# Patient Record
Sex: Female | Born: 1980 | Race: Black or African American | Hispanic: No | Marital: Single | Smoking: Never smoker
Health system: Southern US, Community
[De-identification: ages and names within clinical notes are randomized; demographics above are authoritative.]

## PROBLEM LIST (undated history)

## (undated) HISTORY — PX: ABDOMINAL HYSTERECTOMY: SHX81

---

## 2012-11-14 ENCOUNTER — Encounter: Payer: Self-pay | Admitting: *Deleted

## 2012-11-14 ENCOUNTER — Ambulatory Visit: Payer: Self-pay

## 2012-11-14 DIAGNOSIS — R0602 Shortness of breath: Secondary | ICD-10-CM

## 2012-11-14 DIAGNOSIS — R079 Chest pain, unspecified: Secondary | ICD-10-CM | POA: Insufficient documentation

## 2012-11-14 LAB — COMPREHENSIVE METABOLIC PANEL
Albumin: 3.7 g/dL (ref 3.4–5.0)
Alkaline Phosphatase: 78 U/L (ref 50–136)
Anion Gap: 12 (ref 7–16)
Bilirubin,Total: 0.5 mg/dL (ref 0.2–1.0)
Calcium, Total: 9.4 mg/dL (ref 8.5–10.1)
Co2: 28 mmol/L (ref 21–32)
EGFR (African American): >60
Glucose: 96 mg/dL (ref 65–99)
Osmolality: 279 (ref 275–301)
Potassium: 3.5 mmol/L (ref 3.5–5.1)
SGOT(AST): 16 U/L (ref 15–37)
SGPT (ALT): 35 U/L (ref 12–78)
Total Protein: 8.3 g/dL — ABNORMAL HIGH (ref 6.4–8.2)

## 2012-11-14 LAB — URINALYSIS, COMPLETE
Bilirubin,UR: NEGATIVE
Blood: NEGATIVE
Glucose,UR: NEGATIVE mg/dL (ref 0–75)
Ketone: NEGATIVE
Leukocyte Esterase: NEGATIVE
Nitrite: NEGATIVE
Ph: 6 (ref 4.5–8.0)

## 2012-11-14 LAB — CBC WITH DIFFERENTIAL/PLATELET
Basophil #: 0.1 10*3/uL (ref 0.0–0.1)
Basophil %: 1 %
Eosinophil %: 0.6 %
HCT: 39.6 % (ref 35.0–47.0)
Lymphocyte #: 2.5 10*3/uL (ref 1.0–3.6)
Lymphocyte %: 23.3 %
MCH: 27.3 pg (ref 26.0–34.0)
MCHC: 32.7 g/dL (ref 32.0–36.0)
Monocyte #: 0.6 x10 3/mm (ref 0.2–0.9)
Monocyte %: 5.9 %
Neutrophil #: 7.5 10*3/uL — ABNORMAL HIGH (ref 1.4–6.5)
Neutrophil %: 69.2 %
Platelet: 290 10*3/uL (ref 150–440)
RDW: 13.3 % (ref 11.5–14.5)
WBC: 10.9 10*3/uL (ref 3.6–11.0)

## 2012-11-14 LAB — OCCULT BLOOD X 1 CARD TO LAB, STOOL: Occult Blood, Feces: NEGATIVE

## 2012-11-14 LAB — AMYLASE: Amylase: 46 U/L (ref 25–115)

## 2012-11-15 ENCOUNTER — Ambulatory Visit: Payer: PRIVATE HEALTH INSURANCE | Admitting: Cardiovascular Disease

## 2012-11-15 ENCOUNTER — Ambulatory Visit: Payer: Self-pay

## 2014-08-18 IMAGING — US ABDOMEN ULTRASOUND LIMITED
1 series · 14 of 25 positions shown · non-contrast
Comparison: none

REASON FOR EXAM: RUQ abd pain gallbladder pancreas liver
COMMENTS:

PROCEDURE:     US  - US ABDOMEN LIMITED SURVEY  - November 15, 2012  [DATE]
RESULT:     History: Pain.
Comparison Study: No prior.
Fines: Liver normal. Pancreas normal. Gallbladder normal. Gallbladder wall
thickness 2.3 mm. Common bile duct caliber 3.4 mm.

[Series 1: abdomen ultrasound limited · 0.31mm/px · 14 of 60 slices shown]
[im 1/60]
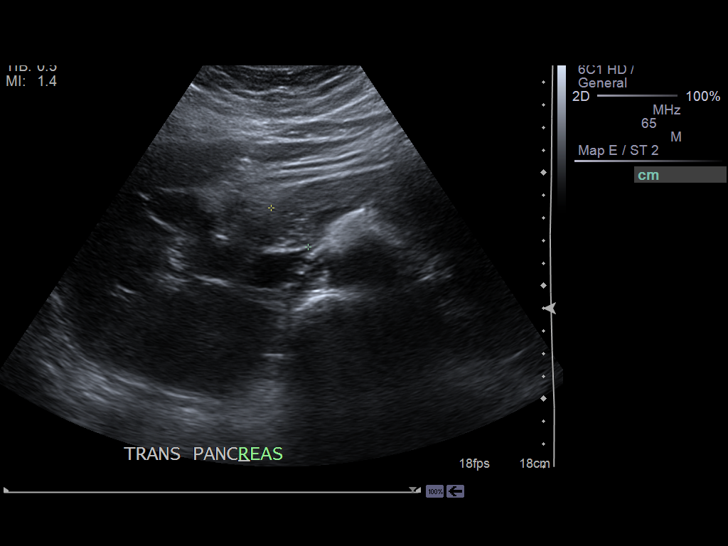
[im 5/60]
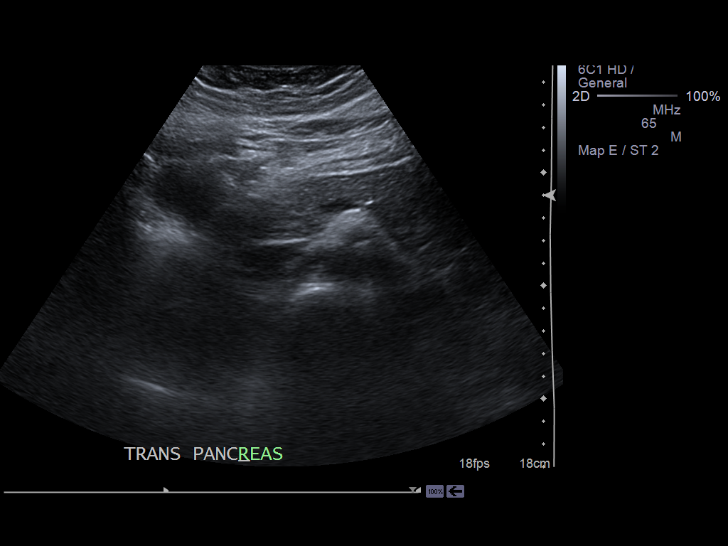
[im 10/60]
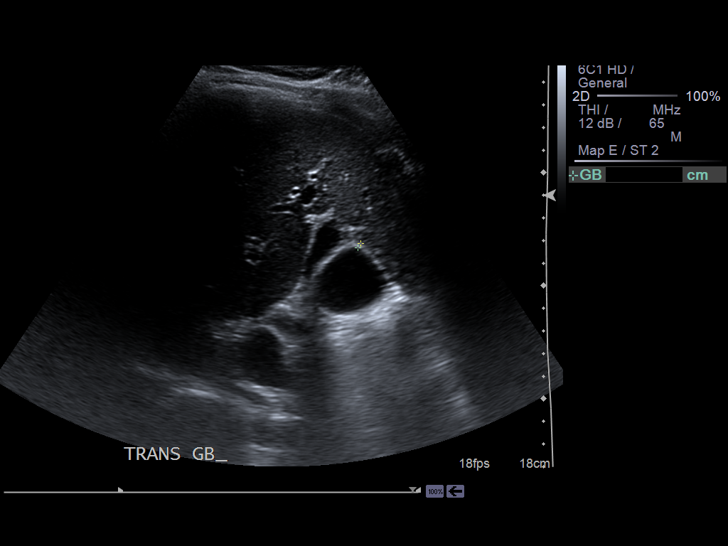
[im 15/60]
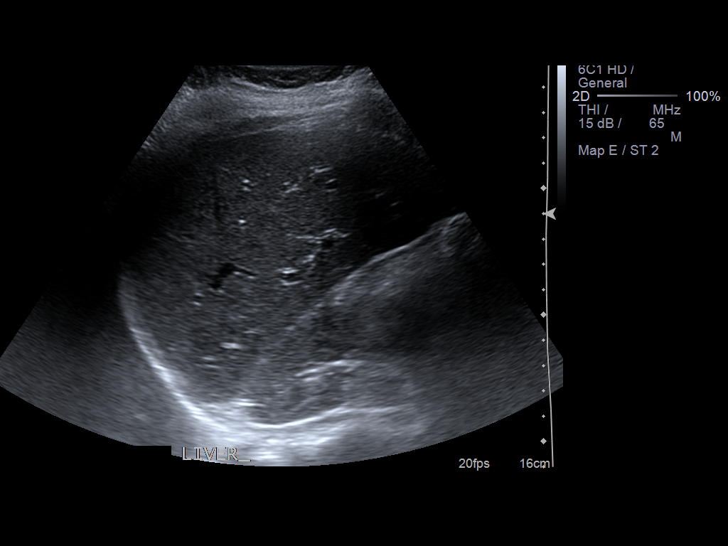
[im 20/60]
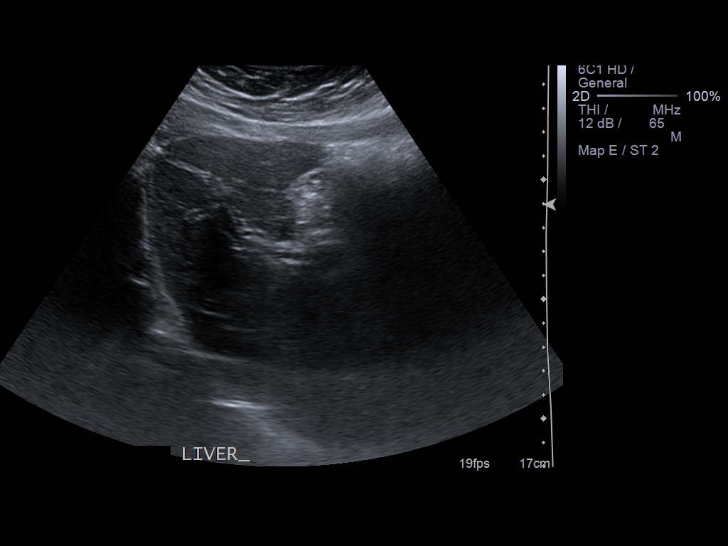
[im 23/60]
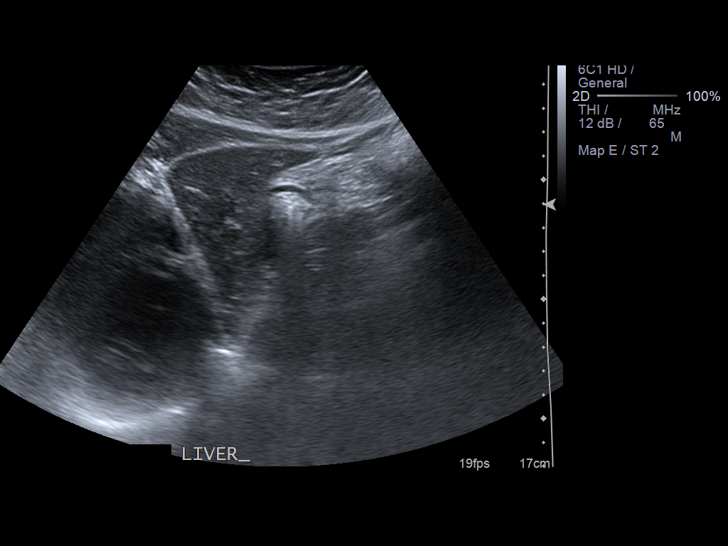
[im 28/60]
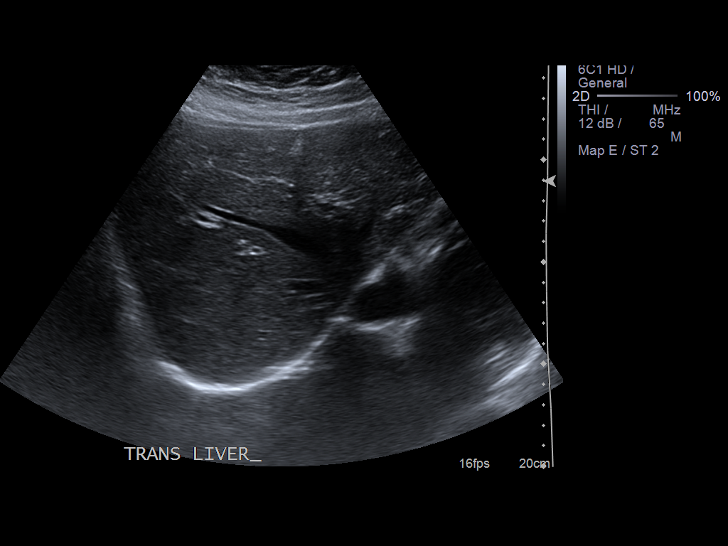
[im 32/60]
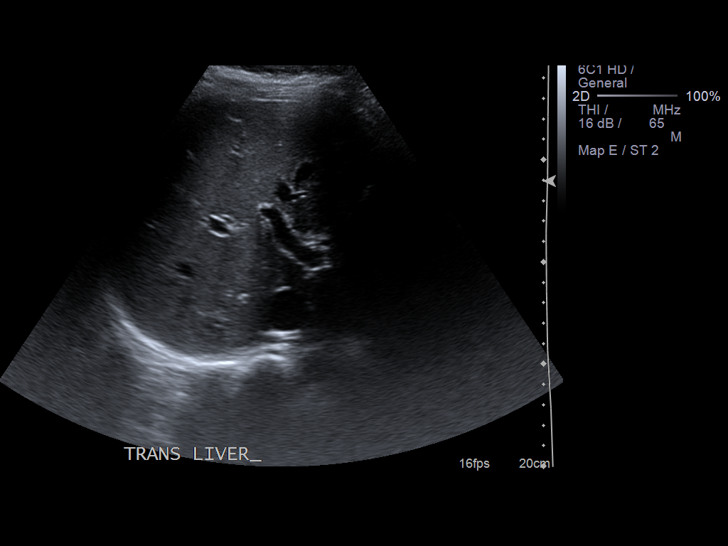
[im 37/60]
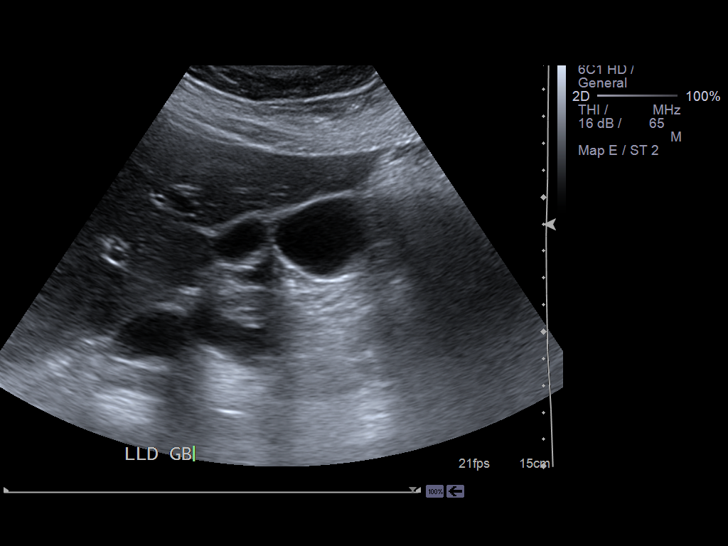
[im 40/60]
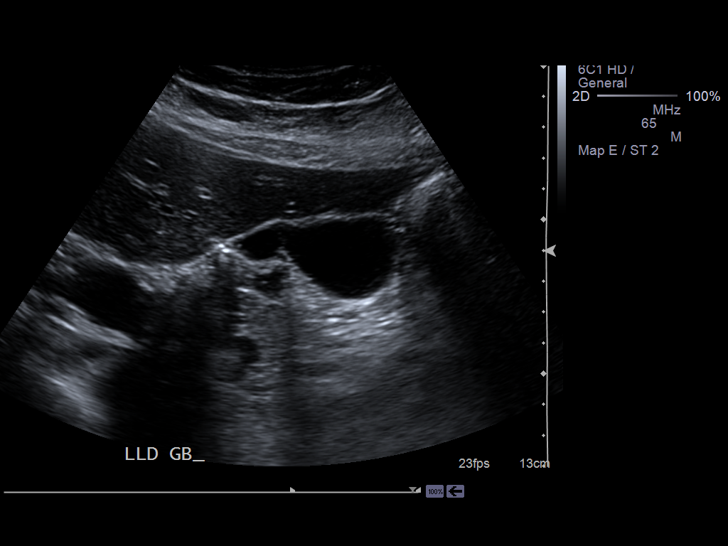
[im 45/60]
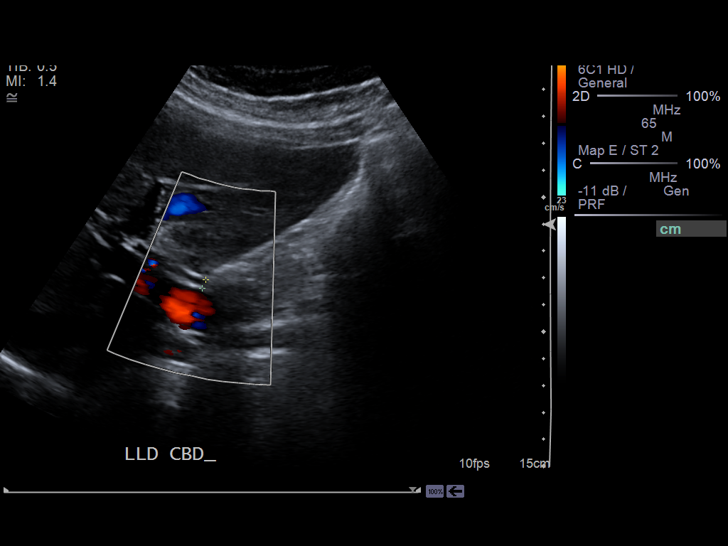
[im 50/60]
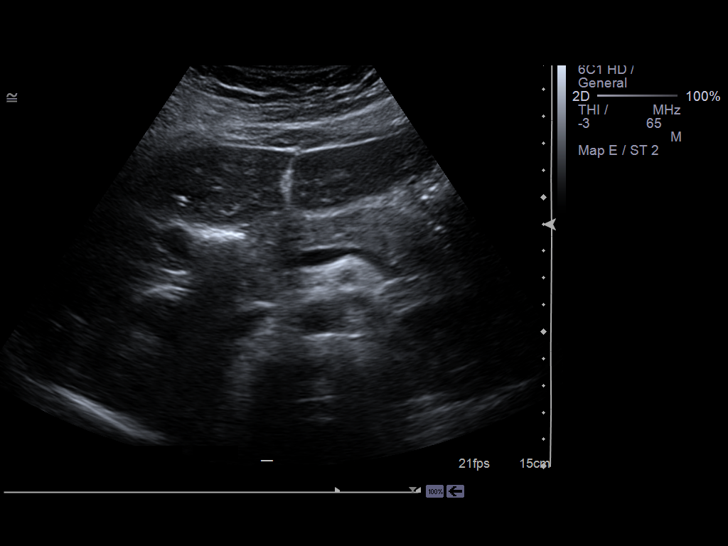
[im 55/60]
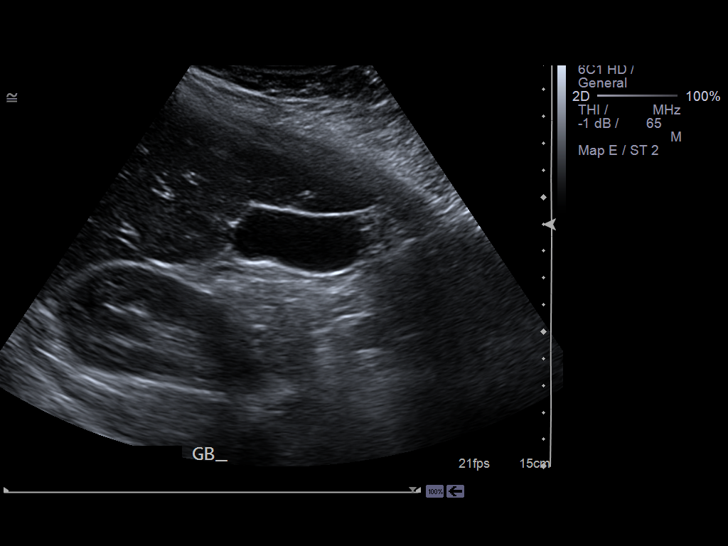
[im 60/60]
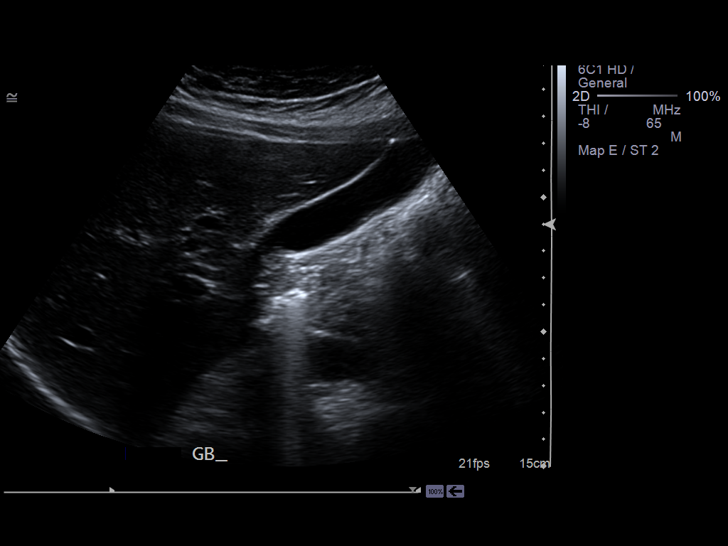

[14 of 25 positions shown; findings below may reference images not displayed]

IMPRESSION: Normal exam.

## 2020-11-09 ENCOUNTER — Ambulatory Visit
Admission: EM | Admit: 2020-11-09 | Discharge: 2020-11-09 | Disposition: A | Discharge status: Home / Self Care | Payer: BC Managed Care – PPO | Attending: Sports Medicine | Admitting: Sports Medicine

## 2020-11-09 ENCOUNTER — Encounter: Payer: Self-pay | Admitting: Emergency Medicine

## 2020-11-09 ENCOUNTER — Other Ambulatory Visit: Payer: Self-pay

## 2020-11-09 DIAGNOSIS — K047 Periapical abscess without sinus: Secondary | ICD-10-CM

## 2020-11-09 DIAGNOSIS — K0889 Other specified disorders of teeth and supporting structures: Secondary | ICD-10-CM

## 2020-11-09 DIAGNOSIS — K029 Dental caries, unspecified: Secondary | ICD-10-CM

## 2020-11-09 MED ORDER — CLINDAMYCIN HCL 150 MG PO CAPS: 450.0000 mg | ORAL_CAPSULE | Freq: Three times a day (TID) | ORAL | 0 refills | Status: AC

## 2020-11-09 MED ORDER — TRAMADOL HCL 50 MG PO TABS: 50.0000 mg | ORAL_TABLET | Freq: Four times a day (QID) | ORAL | 0 refills | Status: AC | PRN

## 2020-11-09 NOTE — ED Triage Notes (Signed)
Patient c/o left sided dental pain that stated yesterday.  Patient states that the pain radiates to her left ear.  Patient denies fever.

## 2020-11-09 NOTE — ED Provider Notes (Signed)
[facility]    CSN: [id number] Arrival date & time: 11/09/20  0944      History   Chief Complaint Chief Complaint  Patient presents with   Dental Pain   Otalgia    HPI Victoria Mckinney is a 40 y.o. female.   40 year old female who presents for evaluation of left sided upper mouth pain.  She reports having tooth pain just anterior to her molar.  She does have a dentist and [facility] but has not called them and does not have an appointment yet.  No fever shakes chills.  No nausea vomiting diarrhea.  She says that the pain does radiate a little bit into her left ear.  No real swelling or redness noted.  No other issues or problems are offered.  No red flag signs or symptoms elicited on history.   History reviewed. No pertinent past medical history.  Patient Active Problem List   Diagnosis Date Noted   Chest pain, unspecified 11/14/2012   Shortness of breath 11/14/2012    Past Surgical History:  Procedure Laterality Date   ABDOMINAL HYSTERECTOMY      OB History   No obstetric history on file.      Home Medications    Prior to Admission medications   Medication Sig Start Date End Date Taking? Authorizing Provider  clindamycin (CLEOCIN) 150 MG capsule Take 3 capsules (450 mg total) by mouth 3 (three) times daily for 7 days. 11/09/20 11/16/20 Yes Delton See, MD  traMADol (ULTRAM) 50 MG tablet Take 1 tablet (50 mg total) by mouth every 6 (six) hours as needed. 11/09/20  Yes Delton See, MD    Family History Family History  Problem Relation Age of Onset   High Cholesterol Mother    Diabetes Mother    Hypertension Mother    Asthma Mother    Diabetes Father    Hypertension Father    High Cholesterol Father    Diabetes Maternal Grandmother    Stroke Maternal Grandmother     Social History Social History   Tobacco Use   Smoking status: Never   Smokeless tobacco: Never  Vaping Use   Vaping Use: Never used  Substance Use Topics   Alcohol  use: Not Currently   Drug use: Never     Allergies   Penicillins and Other   Review of Systems Review of Systems  Constitutional:  Negative for activity change, appetite change, chills, diaphoresis, fatigue and fever.  HENT:  Positive for dental problem and ear pain. Negative for congestion, facial swelling, hearing loss, postnasal drip, rhinorrhea, sinus pressure, sinus pain, sneezing and sore throat.   Eyes:  Negative for pain.  Respiratory:  Negative for cough, chest tightness and shortness of breath.   Cardiovascular:  Negative for chest pain and palpitations.  Gastrointestinal:  Negative for abdominal pain, diarrhea, nausea and vomiting.  Genitourinary:  Negative for dysuria.  Musculoskeletal:  Negative for back pain, myalgias and neck pain.  Skin:  Negative for color change, pallor, rash and wound.  Neurological:  Negative for dizziness, light-headedness, numbness and headaches.  All other systems reviewed and are negative.   Physical Exam Triage Vital Signs ED Triage Vitals  Enc Vitals Group     BP 11/09/20 1009 (!) 136/96     Pulse Rate 11/09/20 1009 76     Resp 11/09/20 1009 14     Temp 11/09/20 1009 98.6 F (37 C)     Temp Source 11/09/20 1009 Oral  SpO2 11/09/20 1009 99 %     Weight 11/09/20 1007 230 lb (104.3 kg)     Height 11/09/20 1007 5\' 2"  (1.575 m)     Head Circumference --      Peak Flow --      Pain Score 11/09/20 1006 10     Pain Loc --      Pain Edu? --      Excl. in GC? --    No data found.  Updated Vital Signs BP (!) 136/96 (BP Location: Left Arm)   Pulse 76   Temp 98.6 F (37 C) (Oral)   Resp 14   Ht 5\' 2"  (1.575 m)   Wt 104.3 kg   LMP 10/06/2012   SpO2 99%   BMI 42.07 kg/m   Visual Acuity Right Eye Distance:   Left Eye Distance:   Bilateral Distance:    Right Eye Near:   Left Eye Near:    Bilateral Near:     Physical Exam Vitals and nursing note reviewed.  Constitutional:      General: She is not in acute distress.     Appearance: Normal appearance. She is not ill-appearing, toxic-appearing or diaphoretic.  HENT:     Head: Normocephalic and atraumatic.     Right Ear: Tympanic membrane normal.     Left Ear: Tympanic membrane normal.     Nose: Nose normal.     Mouth/Throat:     Lips: Pink.     Mouth: Mucous membranes are moist.     Dentition: Abnormal dentition. Dental tenderness and dental caries present. No dental abscesses.     Pharynx: Oropharynx is clear. Uvula midline.   Eyes:     General: No scleral icterus.       Right eye: No discharge.        Left eye: No discharge.     Extraocular Movements: Extraocular movements intact.     Conjunctiva/sclera: Conjunctivae normal.     Pupils: Pupils are equal, round, and reactive to light.  Cardiovascular:     Rate and Rhythm: Normal rate and regular rhythm.     Pulses: Normal pulses.     Heart sounds: Normal heart sounds. No murmur heard.   No friction rub. No gallop.  Pulmonary:     Effort: Pulmonary effort is normal.     Breath sounds: Normal breath sounds. No stridor. No wheezing, rhonchi or rales.  Musculoskeletal:     Cervical back: Normal range of motion and neck supple.  Skin:    General: Skin is warm and dry.     Capillary Refill: Capillary refill takes less than 2 seconds.  Neurological:     General: No focal deficit present.     Mental Status: She is alert and oriented to person, place, and time.     UC Treatments / Results  Labs (all labs ordered are listed, but only abnormal results are displayed) Labs Reviewed - No data to display  EKG   Radiology No results found.  Procedures Procedures (including critical care time)  Medications Ordered in UC Medications - No data to display  Initial Impression / Assessment and Plan / UC Course  I have reviewed the triage vital signs and the nursing notes.  Pertinent labs & imaging results that were available during my care of the patient were reviewed by me and considered in my  medical decision making (see chart for details).  Clinical impression: Left upper tooth pain just anterior to the molar.  On exam it appears as though she has a cavity with infection.  No actual abscess.  Treatment plan: 1.  The findings and treatment plan were discussed in detail with the patient.  Patient was in agreement. 2.  We will go ahead and treat her with an antibiotic.  She does have a penicillin allergy and sent in clindamycin 450 mg 3 times a day for a week.  Also gave her tramadol for her pain. 3.  Educational handouts. 4.  She can use over-the-counter Tylenol or ibuprofen to supplement her pain. 5.  I encouraged her to contact her dentist on Monday and get seen. 6.  Supportive care and if her symptoms worsen she should go to the ER if she cannot get into the dentist. 7.  She was discharged in stable condition will follow-up here as needed.    Final Clinical Impressions(s) / UC Diagnoses   Final diagnoses:  Dental caries  Pain, dental  Dental infection  Tooth ache     Discharge Instructions      As we discussed, I am treating you for a dental infection.  Since you have a penicillin allergy I am using clindamycin. Please see educational handouts. Please call your dentist on Monday. If you develop worsening symptoms it could be that you are developing an abscess and you need to go to the ER. I also provided you a small course of pain medication.  You can supplement that with Motrin or Tylenol or ibuprofen.     ED Prescriptions     Medication Sig Dispense Auth. Provider   clindamycin (CLEOCIN) 150 MG capsule Take 3 capsules (450 mg total) by mouth 3 (three) times daily for 7 days. 63 capsule Delton See, MD   traMADol (ULTRAM) 50 MG tablet Take 1 tablet (50 mg total) by mouth every 6 (six) hours as needed. 15 tablet Delton See, MD      PDMP not reviewed this encounter.   Delton See, MD 11/09/20 [phone]

## 2020-11-09 NOTE — Discharge Instructions (Addendum)
As we discussed, I am treating you for a dental infection.  Since you have a penicillin allergy I am using clindamycin. Please see educational handouts. Please call your dentist on Monday. If you develop worsening symptoms it could be that you are developing an abscess and you need to go to the ER. I also provided you a small course of pain medication.  You can supplement that with Motrin or Tylenol or ibuprofen.
